# Patient Record
Sex: Male | Born: 2000 | Hispanic: No | Marital: Single
Health system: Southern US, Community
[De-identification: ages and names within clinical notes are randomized; demographics above are authoritative.]

## PROBLEM LIST (undated history)

## (undated) DIAGNOSIS — H9325 Central auditory processing disorder: Secondary | ICD-10-CM

## (undated) DIAGNOSIS — F840 Autistic disorder: Secondary | ICD-10-CM

## (undated) DIAGNOSIS — J302 Other seasonal allergic rhinitis: Secondary | ICD-10-CM

## (undated) DIAGNOSIS — F809 Developmental disorder of speech and language, unspecified: Secondary | ICD-10-CM

## (undated) DIAGNOSIS — L309 Dermatitis, unspecified: Secondary | ICD-10-CM

## (undated) DIAGNOSIS — F988 Other specified behavioral and emotional disorders with onset usually occurring in childhood and adolescence: Secondary | ICD-10-CM

## (undated) DIAGNOSIS — K5900 Constipation, unspecified: Secondary | ICD-10-CM

## (undated) DIAGNOSIS — R51 Headache: Secondary | ICD-10-CM

## (undated) DIAGNOSIS — F801 Expressive language disorder: Secondary | ICD-10-CM

## (undated) DIAGNOSIS — H5010 Unspecified exotropia: Secondary | ICD-10-CM

## (undated) DIAGNOSIS — Z8669 Personal history of other diseases of the nervous system and sense organs: Secondary | ICD-10-CM

## (undated) HISTORY — DX: Constipation, unspecified: K59.00

## (undated) HISTORY — DX: Other seasonal allergic rhinitis: J30.2

---
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
## 2019-03-14 IMAGING — CT CT CERVICAL SPINE W/O CM
4 of 7 series · 13 of 33 positions shown, 14 images · non-contrast
Comparison: Head CT dated 04/16/2012

CLINICAL DATA: 16-year-old male with trauma.

EXAM:
CT HEAD WITHOUT CONTRAST
CT CERVICAL SPINE WITHOUT CONTRAST
TECHNIQUE: Multidetector CT imaging of the head and cervical spine was
performed following the standard protocol without intravenous
contrast. Multiplanar CT image reconstructions of the cervical spine
were also generated.

[Series 7: c spine soft · axial · 0.29mm/px · z∈[-319,-205]mm · 4 of 96 slices shown]
[im 20/96  soft-tissue]
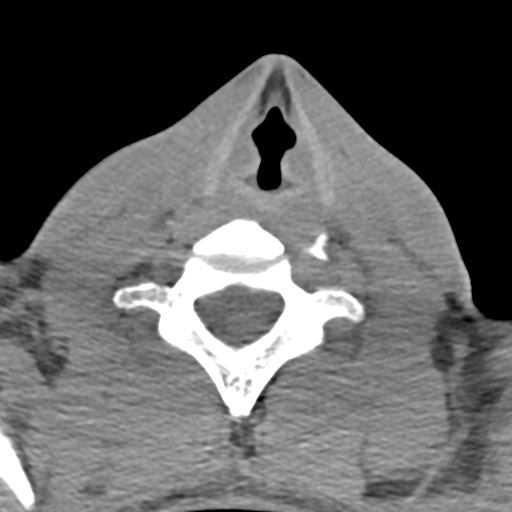
[im 39/96  soft-tissue]
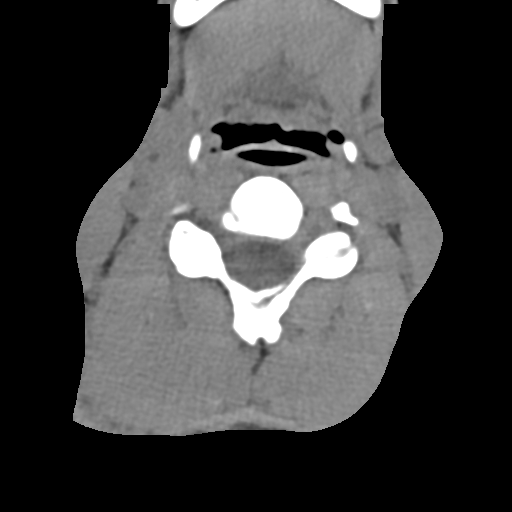
[im 58/96  soft-tissue]
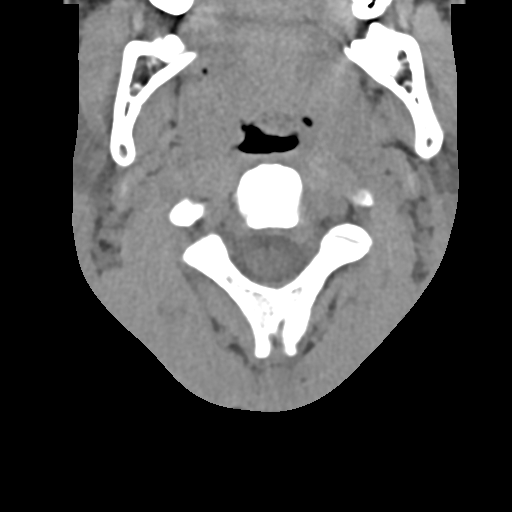
[im 77/96  soft-tissue]
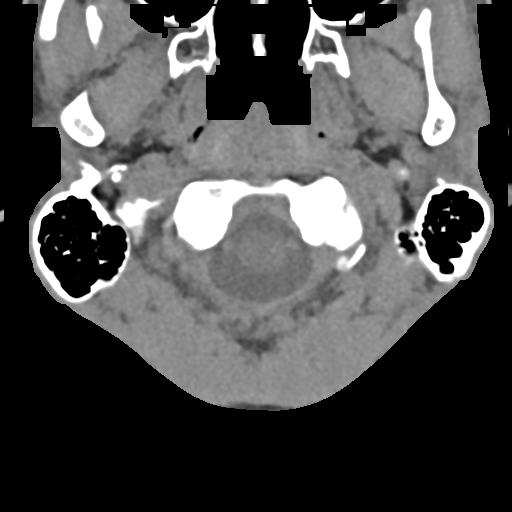

[Series 8: sagittal bone · sagittal · 0.24mm/px · 4 of 47 slices shown]
[im 10/47  bone]
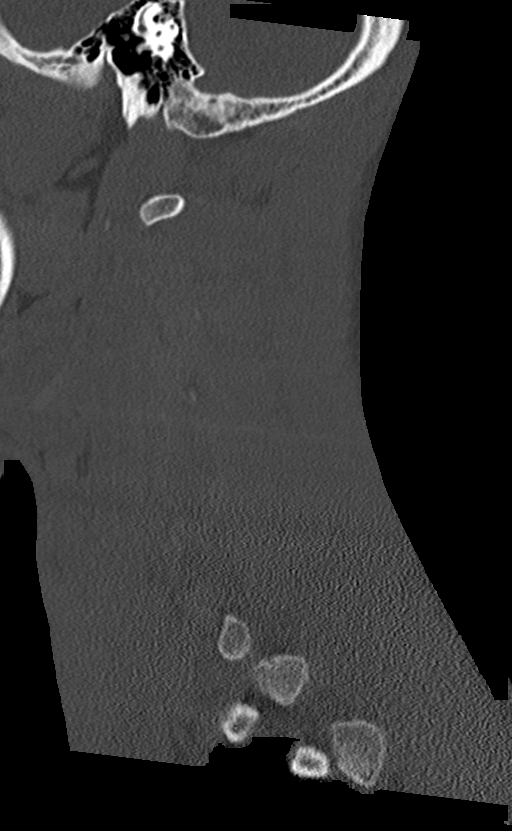
[im 19/47  bone]
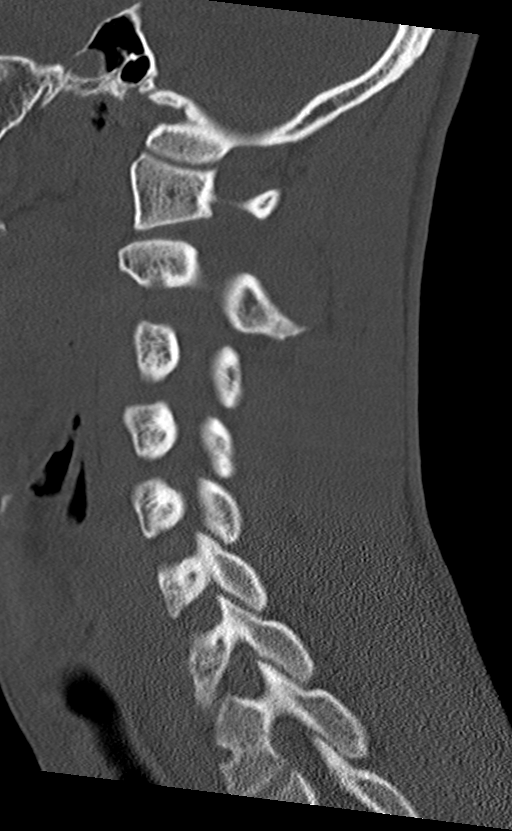
[im 28/47  bone]
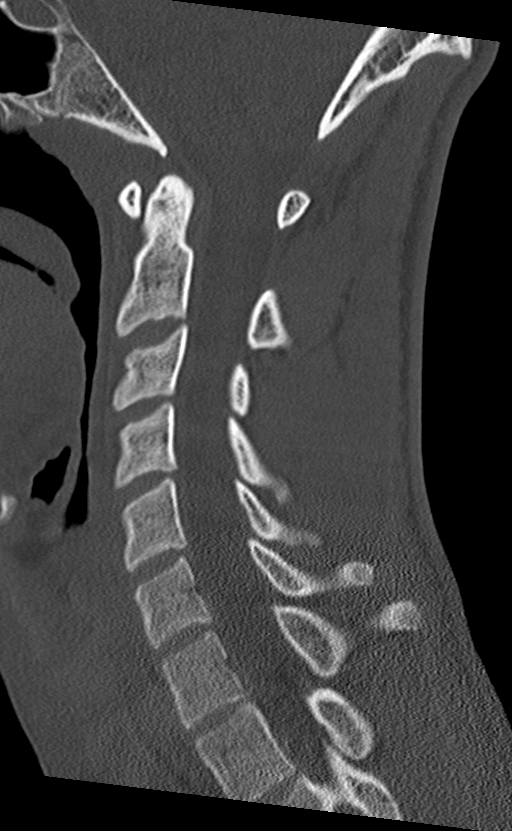
[im 37/47  bone]
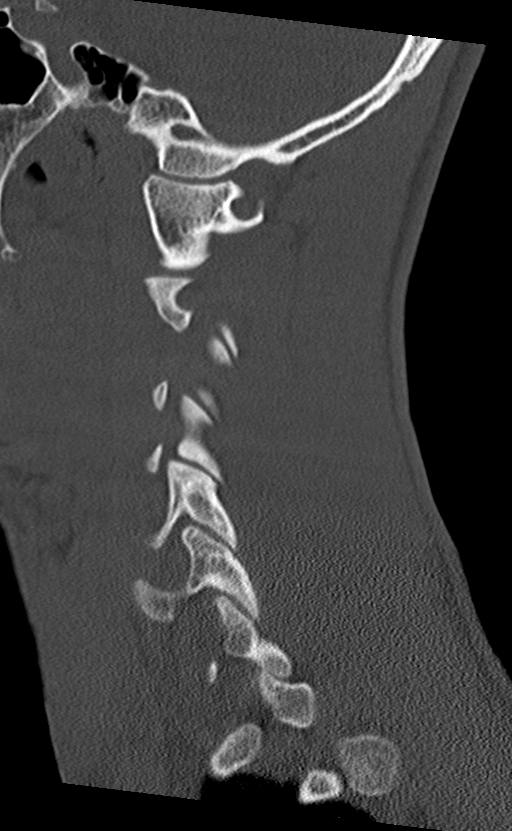

[Series 9: coronal bone · coronal · 0.23mm/px · 1 of 56 slices shown]
[im 28/56  bone]
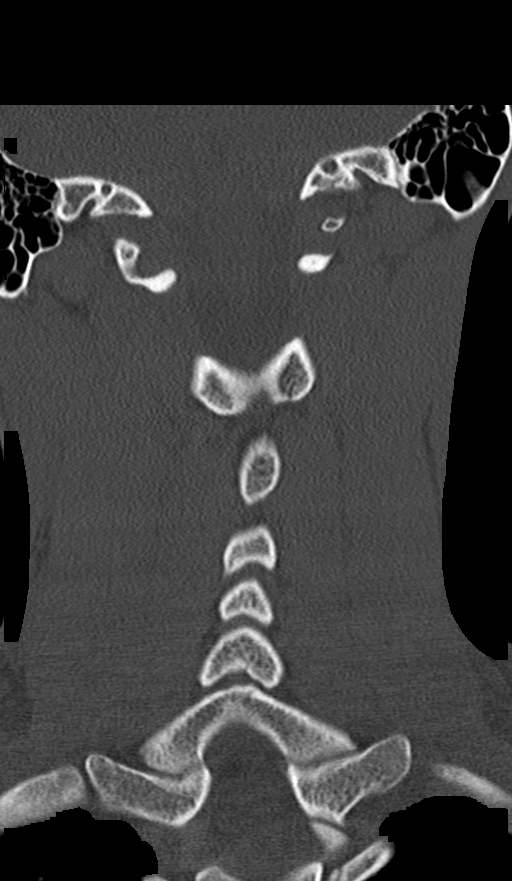

[Series 10: orthogonal bone · axial · 0.23mm/px · z∈[-328,-217]mm · 4 of 97 slices shown, 5 images]
[im 20/97  soft-tissue]
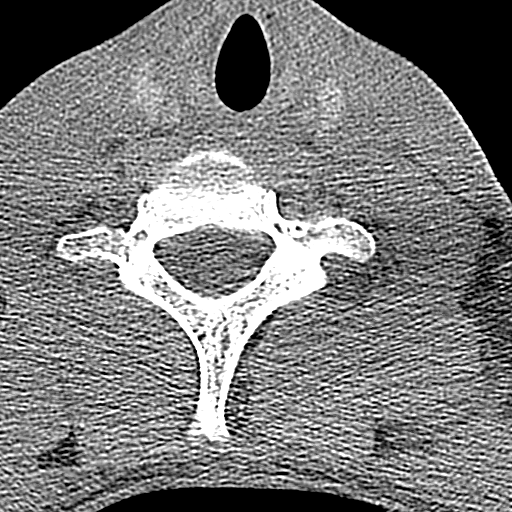
[im 20/97  bone]
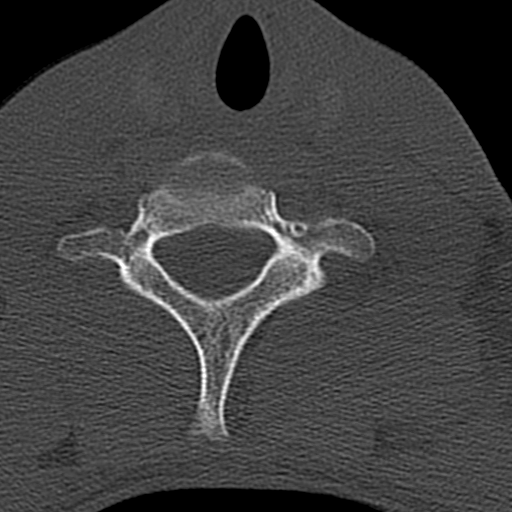
[im 39/97  bone]
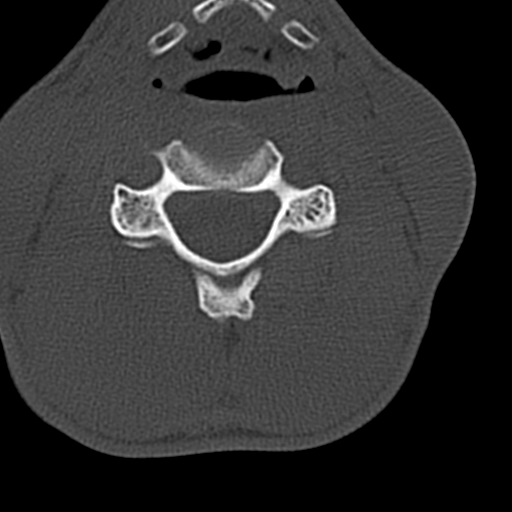
[im 58/97  bone]
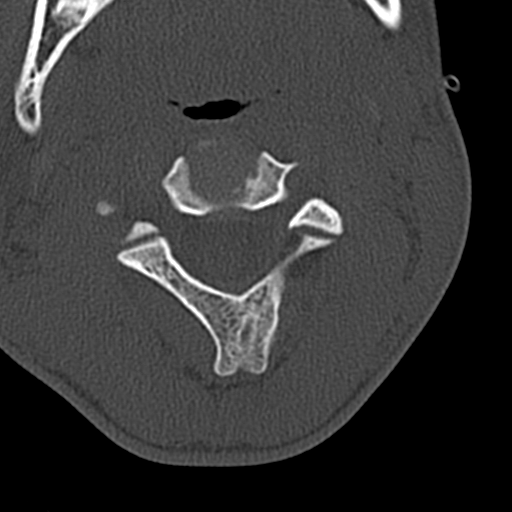
[im 77/97  bone]
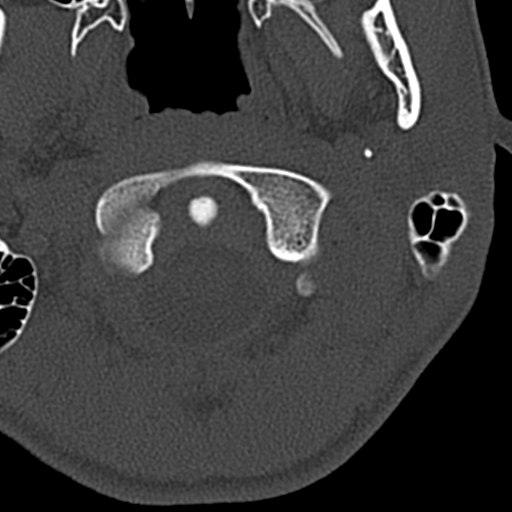

[13 of 33 positions shown; findings below may reference images not displayed]

FINDINGS: CT HEAD FINDINGS

Brain: No evidence of acute infarction, hemorrhage, hydrocephalus,
extra-axial collection or mass lesion/mass effect.

Vascular: No hyperdense vessel or unexpected calcification.

Skull: Normal. Negative for fracture or focal lesion.

Sinuses/Orbits: No acute finding.

Other: None

CT CERVICAL SPINE FINDINGS

Alignment: Normal.

Skull base and vertebrae: No acute fracture. No primary bone lesion
or focal pathologic process.

Soft tissues and spinal canal: No prevertebral fluid or swelling. No
visible canal hematoma.

Disc levels:  No acute findings.  No degenerative changes.

Upper chest: Negative.

Other: None
IMPRESSION: 1. Normal noncontrast CT of the brain.
2. No acute/traumatic cervical spine pathology.

## 2023-09-23 ENCOUNTER — Other Ambulatory Visit: Payer: Self-pay | Admitting: Physician Assistant

## 2023-09-23 DIAGNOSIS — Q8740 Marfan's syndrome, unspecified: Secondary | ICD-10-CM

## 2023-09-23 DIAGNOSIS — I671 Cerebral aneurysm, nonruptured: Secondary | ICD-10-CM
